# Patient Record
Sex: Female | Born: 2000 | Race: White | Hispanic: No | Marital: Single | Smoking: Never smoker
Health system: Southern US, Community
[De-identification: ages and names within clinical notes are randomized; demographics above are authoritative.]

## PROBLEM LIST (undated history)

## (undated) DIAGNOSIS — J45909 Unspecified asthma, uncomplicated: Secondary | ICD-10-CM

---
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
## 2021-02-16 DIAGNOSIS — R509 Fever, unspecified: Secondary | ICD-10-CM | POA: Diagnosis not present

## 2021-02-16 DIAGNOSIS — R051 Acute cough: Secondary | ICD-10-CM | POA: Diagnosis not present

## 2021-02-16 DIAGNOSIS — J0100 Acute maxillary sinusitis, unspecified: Secondary | ICD-10-CM | POA: Diagnosis not present

## 2021-02-16 DIAGNOSIS — Z20828 Contact with and (suspected) exposure to other viral communicable diseases: Secondary | ICD-10-CM | POA: Diagnosis not present

## 2022-01-31 DIAGNOSIS — F419 Anxiety disorder, unspecified: Secondary | ICD-10-CM | POA: Diagnosis not present

## 2022-02-22 DIAGNOSIS — R300 Dysuria: Secondary | ICD-10-CM | POA: Diagnosis not present

## 2022-02-22 DIAGNOSIS — N3091 Cystitis, unspecified with hematuria: Secondary | ICD-10-CM | POA: Diagnosis not present

## 2022-07-20 DIAGNOSIS — M419 Scoliosis, unspecified: Secondary | ICD-10-CM | POA: Diagnosis not present

## 2023-02-16 ENCOUNTER — Ambulatory Visit (HOSPITAL_BASED_OUTPATIENT_CLINIC_OR_DEPARTMENT_OTHER): Payer: BC Managed Care – PPO | Admitting: Student

## 2023-02-16 ENCOUNTER — Ambulatory Visit (HOSPITAL_BASED_OUTPATIENT_CLINIC_OR_DEPARTMENT_OTHER): Payer: BC Managed Care – PPO

## 2023-02-16 ENCOUNTER — Encounter (HOSPITAL_BASED_OUTPATIENT_CLINIC_OR_DEPARTMENT_OTHER): Payer: Self-pay | Admitting: Student

## 2023-02-16 DIAGNOSIS — M25562 Pain in left knee: Secondary | ICD-10-CM

## 2023-02-16 DIAGNOSIS — M7652 Patellar tendinitis, left knee: Secondary | ICD-10-CM

## 2023-02-16 NOTE — Progress Notes (Signed)
Chief Complaint: Left knee pain     History of Present Illness:    Jenny Long is a 22 y.o. female presenting to clinic today for evaluation of left knee pain.  This began just over 1 month ago without any known injury however she had just began vacation at [employer] which involved a lot of walking when the pain started.  Denies any history of knee issues in the past.  States that the pain is mainly in the front of the knee toward the base of the kneecap and radiates slightly medially.  Worsened with straightening of the knee.  She does have history of scoliosis for which she occasionally takes meloxicam however this has not helped with her knee pain.  She is currently taking prerequisites and hoping to apply soon to nursing school.  Surgical History:   None  PMH/PSH/Family History/Social History/Meds/Allergies:    Past Medical History:  Diagnosis Date   Asthma    no tx  in over 6 months   Past Surgical History:  Procedure Laterality Date   ORIF RADIAL FRACTURE Left 08/05/2013   Procedure: OPEN REDUCTION INTERNAL FIXATION (ORIF) LEFT RADIAL FRACTURE;  Surgeon: Tami Ribas, MD;  Location: [facility];  Service: Orthopedics;  Laterality: Left;  CRPP VS ORIF LEFT RADIAL NECK  FRACTURE    Social History   Socioeconomic History   Marital status: Single    Spouse name: Not on file   Number of children: Not on file   Years of education: Not on file   Highest education level: Not on file  Occupational History   Not on file  Tobacco Use   Smoking status: Never   Smokeless tobacco: Never  Substance and Sexual Activity   Alcohol use: No   Drug use: No   Sexual activity: Never  Other Topics Concern   Not on file  Social History Narrative   Not on file   Social Determinants of Health   Financial Resource Strain: Not on file  Food Insecurity: Not on file  Transportation Needs: Not on file  Physical Activity: Not on file   Stress: Not on file  Social Connections: Not on file   History reviewed. No pertinent family history. No Known Allergies Current Outpatient Medications  Medication Sig Dispense Refill   acetaminophen-codeine (TYLENOL #3) 300-30 MG per tablet Take 1 tablet by mouth every 6 (six) hours as needed for moderate pain. 20 tablet 0   albuterol (PROVENTIL HFA;VENTOLIN HFA) 108 (90 BASE) MCG/ACT inhaler Inhale into the lungs every 6 (six) hours as needed for wheezing or shortness of breath.     ibuprofen (ADVIL,MOTRIN) 100 MG/5ML suspension Take 5 mg/kg by mouth every 6 (six) hours as needed.     levalbuterol (XOPENEX) 0.31 MG/3ML nebulizer solution Take 1 ampule by nebulization every 8 (eight) hours as needed for wheezing. Has not used in 6 months or more     loratadine (CLARITIN) 10 MG tablet Take 10 mg by mouth daily.     No current facility-administered medications for this visit.   No results found.  Review of Systems:   A ROS was performed including pertinent positives and negatives as documented in the HPI.  Physical Exam :   Constitutional: NAD and appears stated age Neurological: Alert and oriented Psych: Appropriate affect  and cooperative There were no vitals taken for this visit.   Comprehensive Musculoskeletal Exam:    Active range of motion of the left knee from 0 to 130 degrees without crepitus.  No evidence of effusion, erythema, or warmth.  Tenderness over the tibial tubercle and distal portion of the patellar tendon.  Mild swelling over this area that is not fluctuant.  No joint line tenderness.  No instability with varus or valgus stress.  Strength 5/5 with flexion and extension.  Imaging:   Xray (left knee 4 views): Negative   I personally reviewed and interpreted the radiographs.   Assessment:   22 y.o. female with 1 month history of atraumatic left knee pain.  This does appear consistent with patellar tendinitis as the pain is isolated over the distal patella  tendon and insertion onto tibial tubercle.  In discussion of treatment options I have offered a referral to physical therapy.  She would like to try and manage this on her own for now and will consider this in the future.  I have recommended NSAIDs, ice and rest.  She can plan to return to clinic as needed and can consider PT referral should symptoms persist.  Plan :    - Return to clinic as needed     I personally saw and evaluated the patient, and participated in the management and treatment plan.  Hazle Nordmann, PA-C Orthopedics
